# Patient Record
Sex: Male | Born: 1960 | Marital: Married | Smoking: Never smoker
Health system: Southern US, Community
[De-identification: ages and names within clinical notes are randomized; demographics above are authoritative.]

## PROBLEM LIST (undated history)

## (undated) DIAGNOSIS — E119 Type 2 diabetes mellitus without complications: Secondary | ICD-10-CM

## (undated) DIAGNOSIS — I10 Essential (primary) hypertension: Secondary | ICD-10-CM

## (undated) HISTORY — PX: ARTHROSCOPIC REPAIR ACL: SUR80

## (undated) HISTORY — PX: PECTORALIS TENDON REPAIR: SHX6510

---

## 2011-06-14 ENCOUNTER — Ambulatory Visit (HOSPITAL_BASED_OUTPATIENT_CLINIC_OR_DEPARTMENT_OTHER)
Admission: RE | Admit: 2011-06-14 | Discharge: 2011-06-14 | Disposition: A | Discharge status: Home / Self Care | Payer: BC Managed Care – PPO | Source: Ambulatory Visit | Attending: Orthopedic Surgery | Admitting: Orthopedic Surgery

## 2011-06-14 DIAGNOSIS — Z01812 Encounter for preprocedural laboratory examination: Secondary | ICD-10-CM | POA: Insufficient documentation

## 2011-06-14 DIAGNOSIS — M2240 Chondromalacia patellae, unspecified knee: Secondary | ICD-10-CM | POA: Insufficient documentation

## 2011-06-14 DIAGNOSIS — M23329 Other meniscus derangements, posterior horn of medial meniscus, unspecified knee: Secondary | ICD-10-CM | POA: Insufficient documentation

## 2011-06-14 LAB — POCT I-STAT 4, (NA,K, GLUC, HGB,HCT)
Glucose, Bld: 122 mg/dL — ABNORMAL HIGH (ref 70–99)
HCT: 45 % (ref 39.0–52.0)
Hemoglobin: 15.3 g/dL (ref 13.0–17.0)

## 2011-06-17 NOTE — Op Note (Signed)
  [name], [name]             ACCOUNT NO.:  [id number]  MEDICAL RECORD NO.:  [id number]  LOCATION:                                 FACILITY:  PHYSICIAN:  Ollen Gross, M.D.    DATE OF BIRTH:  1961/01/29  DATE OF PROCEDURE:  06/14/2011 DATE OF DISCHARGE:                              OPERATIVE REPORT   PREOPERATIVE DIAGNOSIS:  Right knee medial meniscal tear.  POSTOPERATIVE DIAGNOSIS:  Right knee medial meniscal tear.  PROCEDURE:  Right knee arthroscopy with meniscal debridement.  SURGEON:  Ollen Gross, MD  ASSISTANT.:  None.  ANESTHESIA:  General.  ESTIMATED BLOOD LOSS:  Minimal.  DRAINS:  None.  COMPLICATIONS:  None.  CONDITION:  Stable to recovery.  BRIEF CLINICAL NOTE:  Jimmy Hernandez is a 50 year old male who has a several- month history of significant right knee pain and mechanical symptoms. Exam and history suggested a medial meniscal tear confirmed by MRI.  He presents for arthroscopy and debridement.  PROCEDURE IN DETAIL:  After successful administration of general anesthetic, a tourniquet was placed high on his right thigh and his right lower extremity was prepped and draped in the usual sterile fashion.  Standard superomedial and inferolateral incisions were made, inflow cannula passed, superomedial camera passed inferolateral. Arthroscopic visualization proceeds.  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  Medial and lateral gutters were visualized.  There were no loose bodies.  Flexion valgus force was applied to the knee and medial compartment was entered.  He had a bad tear in the body of the posterior horn and medial meniscus with a fragment flipped centrally.  A spinal needle was used to localize the inferomedial portal.  Small incision made and dilator placed.  The meniscus was debrided back to stable base with a combination of baskets and a 4.2-mm  shaver and then sealed off with the ArthroCare device.  It was found to be stable after this.  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  Intercondylar notch was visualized.  He does have attenuation of the ACL.  Lateral compartment was entered and it looks normal.  Joints again inspected.  No other tears, loose bodies, or defects were noted.  The arthroscopic equipment was then removed from the inferior portals which were closed with interrupted 4-0 nylon.  20 cc of 0.25% Marcaine with epi were then injected through the inflow cannula and then that was removed and that portal closed with nylon.  Incisions were cleaned and dried and a bulky sterile dressing was applied.  He was then awakened and transported to recovery in stable condition.     Ollen Gross, M.D.     FA/MEDQ  D:  06/14/2011  T:  06/14/2011  Job:  [id number]  Electronically Signed by Ollen Gross M.D. on 06/17/2011 04:42:20 PM

## 2016-05-08 DIAGNOSIS — M79641 Pain in right hand: Secondary | ICD-10-CM | POA: Diagnosis not present

## 2016-05-08 DIAGNOSIS — M1811 Unilateral primary osteoarthritis of first carpometacarpal joint, right hand: Secondary | ICD-10-CM | POA: Diagnosis not present

## 2016-05-08 DIAGNOSIS — M79644 Pain in right finger(s): Secondary | ICD-10-CM | POA: Diagnosis not present

## 2016-06-18 DIAGNOSIS — H00012 Hordeolum externum right lower eyelid: Secondary | ICD-10-CM | POA: Diagnosis not present

## 2016-08-08 DIAGNOSIS — Z23 Encounter for immunization: Secondary | ICD-10-CM | POA: Diagnosis not present

## 2016-08-08 DIAGNOSIS — E782 Mixed hyperlipidemia: Secondary | ICD-10-CM | POA: Diagnosis not present

## 2016-08-08 DIAGNOSIS — J4530 Mild persistent asthma, uncomplicated: Secondary | ICD-10-CM | POA: Diagnosis not present

## 2016-08-08 DIAGNOSIS — I10 Essential (primary) hypertension: Secondary | ICD-10-CM | POA: Diagnosis not present

## 2016-08-08 DIAGNOSIS — R7309 Other abnormal glucose: Secondary | ICD-10-CM | POA: Diagnosis not present

## 2016-08-08 DIAGNOSIS — J309 Allergic rhinitis, unspecified: Secondary | ICD-10-CM | POA: Diagnosis not present

## 2016-12-02 DIAGNOSIS — J45909 Unspecified asthma, uncomplicated: Secondary | ICD-10-CM | POA: Diagnosis not present

## 2016-12-02 DIAGNOSIS — J069 Acute upper respiratory infection, unspecified: Secondary | ICD-10-CM | POA: Diagnosis not present

## 2017-02-19 DIAGNOSIS — J309 Allergic rhinitis, unspecified: Secondary | ICD-10-CM | POA: Diagnosis not present

## 2017-04-16 DIAGNOSIS — E782 Mixed hyperlipidemia: Secondary | ICD-10-CM | POA: Diagnosis not present

## 2017-04-16 DIAGNOSIS — Z125 Encounter for screening for malignant neoplasm of prostate: Secondary | ICD-10-CM | POA: Diagnosis not present

## 2017-04-16 DIAGNOSIS — Z0000 Encounter for general adult medical examination without abnormal findings: Secondary | ICD-10-CM | POA: Diagnosis not present

## 2017-04-16 DIAGNOSIS — R739 Hyperglycemia, unspecified: Secondary | ICD-10-CM | POA: Diagnosis not present

## 2017-06-30 DIAGNOSIS — M722 Plantar fascial fibromatosis: Secondary | ICD-10-CM | POA: Diagnosis not present

## 2017-06-30 DIAGNOSIS — M7731 Calcaneal spur, right foot: Secondary | ICD-10-CM | POA: Diagnosis not present

## 2017-06-30 DIAGNOSIS — M71571 Other bursitis, not elsewhere classified, right ankle and foot: Secondary | ICD-10-CM | POA: Diagnosis not present

## 2017-06-30 DIAGNOSIS — M7732 Calcaneal spur, left foot: Secondary | ICD-10-CM | POA: Diagnosis not present

## 2017-08-05 DIAGNOSIS — J029 Acute pharyngitis, unspecified: Secondary | ICD-10-CM | POA: Diagnosis not present

## 2017-08-05 DIAGNOSIS — I888 Other nonspecific lymphadenitis: Secondary | ICD-10-CM | POA: Diagnosis not present

## 2017-08-07 DIAGNOSIS — K118 Other diseases of salivary glands: Secondary | ICD-10-CM | POA: Diagnosis not present

## 2017-10-06 DIAGNOSIS — E782 Mixed hyperlipidemia: Secondary | ICD-10-CM | POA: Diagnosis not present

## 2017-10-06 DIAGNOSIS — E119 Type 2 diabetes mellitus without complications: Secondary | ICD-10-CM | POA: Diagnosis not present

## 2017-10-06 DIAGNOSIS — J4530 Mild persistent asthma, uncomplicated: Secondary | ICD-10-CM | POA: Diagnosis not present

## 2017-10-06 DIAGNOSIS — I10 Essential (primary) hypertension: Secondary | ICD-10-CM | POA: Diagnosis not present

## 2018-01-09 DIAGNOSIS — R509 Fever, unspecified: Secondary | ICD-10-CM | POA: Diagnosis not present

## 2018-01-09 DIAGNOSIS — R05 Cough: Secondary | ICD-10-CM | POA: Diagnosis not present

## 2018-04-21 DIAGNOSIS — E782 Mixed hyperlipidemia: Secondary | ICD-10-CM | POA: Diagnosis not present

## 2018-04-21 DIAGNOSIS — Z0000 Encounter for general adult medical examination without abnormal findings: Secondary | ICD-10-CM | POA: Diagnosis not present

## 2018-04-21 DIAGNOSIS — Z1159 Encounter for screening for other viral diseases: Secondary | ICD-10-CM | POA: Diagnosis not present

## 2018-04-21 DIAGNOSIS — E119 Type 2 diabetes mellitus without complications: Secondary | ICD-10-CM | POA: Diagnosis not present

## 2018-04-28 ENCOUNTER — Other Ambulatory Visit: Payer: Self-pay

## 2018-04-28 ENCOUNTER — Emergency Department (HOSPITAL_COMMUNITY): Payer: BLUE CROSS/BLUE SHIELD

## 2018-04-28 ENCOUNTER — Emergency Department (HOSPITAL_COMMUNITY)
Admission: EM | Admit: 2018-04-28 | Discharge: 2018-04-28 | Disposition: A | Discharge status: Home / Self Care | Payer: BLUE CROSS/BLUE SHIELD | Attending: Emergency Medicine | Admitting: Emergency Medicine

## 2018-04-28 ENCOUNTER — Encounter (HOSPITAL_COMMUNITY): Payer: Self-pay | Admitting: Emergency Medicine

## 2018-04-28 DIAGNOSIS — R1030 Lower abdominal pain, unspecified: Secondary | ICD-10-CM | POA: Diagnosis not present

## 2018-04-28 DIAGNOSIS — N201 Calculus of ureter: Secondary | ICD-10-CM | POA: Insufficient documentation

## 2018-04-28 DIAGNOSIS — E119 Type 2 diabetes mellitus without complications: Secondary | ICD-10-CM | POA: Insufficient documentation

## 2018-04-28 DIAGNOSIS — R112 Nausea with vomiting, unspecified: Secondary | ICD-10-CM | POA: Diagnosis not present

## 2018-04-28 DIAGNOSIS — R109 Unspecified abdominal pain: Secondary | ICD-10-CM | POA: Diagnosis not present

## 2018-04-28 DIAGNOSIS — Z7984 Long term (current) use of oral hypoglycemic drugs: Secondary | ICD-10-CM | POA: Diagnosis not present

## 2018-04-28 DIAGNOSIS — I10 Essential (primary) hypertension: Secondary | ICD-10-CM | POA: Diagnosis not present

## 2018-04-28 DIAGNOSIS — R1031 Right lower quadrant pain: Secondary | ICD-10-CM | POA: Diagnosis present

## 2018-04-28 HISTORY — DX: Essential (primary) hypertension: I10

## 2018-04-28 HISTORY — DX: Type 2 diabetes mellitus without complications: E11.9

## 2018-04-28 LAB — URINALYSIS, ROUTINE W REFLEX MICROSCOPIC
BACTERIA UA: NONE SEEN
Bilirubin Urine: NEGATIVE
GLUCOSE, UA: NEGATIVE mg/dL
KETONES UR: 5 mg/dL — AB
Leukocytes, UA: NEGATIVE
Nitrite: NEGATIVE
PROTEIN: 30 mg/dL — AB
RBC / HPF: >50 RBC/hpf — ABNORMAL HIGH (ref 0–5)
Specific Gravity, Urine: 1.016 (ref 1.005–1.030)
pH: 5 (ref 5.0–8.0)

## 2018-04-28 LAB — CBC WITH DIFFERENTIAL/PLATELET
BASOS ABS: 0 10*3/uL (ref 0.0–0.1)
BASOS PCT: 0 %
Eosinophils Absolute: 0.2 10*3/uL (ref 0.0–0.7)
Eosinophils Relative: 2 %
HEMATOCRIT: 43.9 % (ref 39.0–52.0)
Hemoglobin: 15.3 g/dL (ref 13.0–17.0)
LYMPHS PCT: 16 %
Lymphs Abs: 2 10*3/uL (ref 0.7–4.0)
MCH: 30.9 pg (ref 26.0–34.0)
MCHC: 34.9 g/dL (ref 30.0–36.0)
MCV: 88.7 fL (ref 78.0–100.0)
MONO ABS: 0.8 10*3/uL (ref 0.1–1.0)
Monocytes Relative: 7 %
NEUTROS ABS: 9.1 10*3/uL — AB (ref 1.7–7.7)
NEUTROS PCT: 75 %
Platelets: 173 10*3/uL (ref 150–400)
RBC: 4.95 MIL/uL (ref 4.22–5.81)
RDW: 13.2 % (ref 11.5–15.5)
WBC: 12 10*3/uL — AB (ref 4.0–10.5)

## 2018-04-28 LAB — I-STAT CHEM 8, ED
BUN: 22 mg/dL — ABNORMAL HIGH (ref 6–20)
CHLORIDE: 105 mmol/L (ref 101–111)
CREATININE: 0.9 mg/dL (ref 0.61–1.24)
Calcium, Ion: 1.16 mmol/L (ref 1.15–1.40)
GLUCOSE: 171 mg/dL — AB (ref 65–99)
HEMATOCRIT: 43 % (ref 39.0–52.0)
HEMOGLOBIN: 14.6 g/dL (ref 13.0–17.0)
POTASSIUM: 4.2 mmol/L (ref 3.5–5.1)
Sodium: 139 mmol/L (ref 135–145)
TCO2: 23 mmol/L (ref 22–32)

## 2018-04-28 MED ORDER — ONDANSETRON HCL 4 MG/2ML IJ SOLN
4.0000 mg | Freq: Once | INTRAMUSCULAR | Status: AC
  Administered 2018-04-28: 4 mg via INTRAVENOUS
  Filled 2018-04-28: qty 2

## 2018-04-28 MED ORDER — HYDROMORPHONE HCL 1 MG/ML IJ SOLN
0.5000 mg | Freq: Once | INTRAMUSCULAR | Status: AC
  Administered 2018-04-28: 0.5 mg via INTRAVENOUS

## 2018-04-28 MED ORDER — ONDANSETRON 4 MG PO TBDP: 4.0000 mg | ORAL_TABLET | Freq: Three times a day (TID) | ORAL | 0 refills | Status: AC | PRN

## 2018-04-28 MED ORDER — PROCHLORPERAZINE EDISYLATE 10 MG/2ML IJ SOLN
5.0000 mg | Freq: Once | INTRAMUSCULAR | Status: AC
  Administered 2018-04-28: 5 mg via INTRAVENOUS
  Filled 2018-04-28: qty 2

## 2018-04-28 MED ORDER — TAMSULOSIN HCL 0.4 MG PO CAPS: 0.4000 mg | ORAL_CAPSULE | Freq: Every day | ORAL | 0 refills | Status: AC

## 2018-04-28 MED ORDER — SODIUM CHLORIDE 0.9 % IV BOLUS
1000.0000 mL | Freq: Once | INTRAVENOUS | Status: AC
  Administered 2018-04-28: 1000 mL via INTRAVENOUS

## 2018-04-28 MED ORDER — KETOROLAC TROMETHAMINE 30 MG/ML IJ SOLN
15.0000 mg | Freq: Once | INTRAMUSCULAR | Status: AC
  Administered 2018-04-28: 15 mg via INTRAVENOUS
  Filled 2018-04-28: qty 1

## 2018-04-28 MED ORDER — HYDROCODONE-ACETAMINOPHEN 5-325 MG PO TABS: 2.0000 | ORAL_TABLET | ORAL | 0 refills | Status: AC | PRN

## 2018-04-28 MED ORDER — HYDROMORPHONE HCL 1 MG/ML IJ SOLN
0.5000 mg | Freq: Once | INTRAMUSCULAR | Status: AC
  Administered 2018-04-28: 0.5 mg via INTRAVENOUS
  Filled 2018-04-28: qty 1

## 2018-04-28 MED ORDER — ONDANSETRON HCL 4 MG/2ML IJ SOLN
4.0000 mg | Freq: Once | INTRAMUSCULAR | Status: DC
  Filled 2018-04-28: qty 2

## 2018-04-28 MED ORDER — HYDROMORPHONE HCL 1 MG/ML IJ SOLN
0.5000 mg | Freq: Once | INTRAMUSCULAR | Status: DC
  Filled 2018-04-28: qty 1

## 2018-04-28 NOTE — ED Notes (Signed)
I attempted to collect a lab and was unsuccessful

## 2018-04-28 NOTE — ED Provider Notes (Signed)
[facility]-EMERGENCY DEPT Provider Note   CSN: [id number] Arrival date & time: 04/28/18  0408     History   Chief Complaint Chief Complaint  Patient presents with  . Flank Pain    HPI Jimmy Hernandez is a 57 y.o. male.  HPI 57 year old Caucasian male past medical history significant for diabetes and hypertension presents to the emergency department today for evaluation of acute right flank pain.  Patient states that approximately 0230 he developed acute onset of right flank pain.  Patient reports the pain radiates to his lower abdomen.  Patient denies urinary symptoms.  Reports associated nausea and vomiting.  No history of same.  Patient denies associated fevers or chills.  Denies any testicular pain, testicular swelling or penile discharge.  Patient states that the pain is constant and sharp in nature.  Nothing makes better or worse.  Patient did not take anything for the pain prior to arrival.  Patient reports pain is a 10/10.  Pt denies any fever, chill, ha, vision changes, lightheadedness, dizziness, congestion, neck pain, cp, sob, cough,  urinary symptoms, change in bowel habits, melena, hematochezia, lower extremity paresthesias.  Past Medical History:  Diagnosis Date  . Diabetes mellitus without complication (HCC)   . Hypertension     There are no active problems to display for this patient.   Past Surgical History:  Procedure Laterality Date  . ARTHROSCOPIC REPAIR ACL Right   . PECTORALIS TENDON REPAIR          Home Medications    Prior to Admission medications   Medication Sig Start Date End Date Taking? Authorizing Provider  budesonide-formoterol (SYMBICORT) 80-4.5 MCG/ACT inhaler Inhale 2 puffs into the lungs daily.   Yes [provider]  cetirizine (ZYRTEC) 10 MG tablet Take 10 mg by mouth daily.   Yes [provider]  lisinopril (PRINIVIL,ZESTRIL) 10 MG tablet Take 10 mg by mouth daily.   Yes [provider]    metFORMIN (GLUCOPHAGE-XR) 500 MG 24 hr tablet Take 1,000 mg by mouth daily. 04/06/18  Yes [provider]  Omega-3 Fatty Acids (FISH OIL) 1200 MG CAPS Take 1,200-4,800 mg by mouth daily.   Yes [provider]  rosuvastatin (CRESTOR) 20 MG tablet Take 20 mg by mouth daily. 04/15/18  Yes [provider]    Family History History reviewed. No pertinent family history.  Social History Social History   Tobacco Use  . Smoking status: Never Smoker  . Smokeless tobacco: Never Used  Substance Use Topics  . Alcohol use: Never    Frequency: Never  . Drug use: Never     Allergies   Percocet [oxycodone-acetaminophen]   Review of Systems Review of Systems  All other systems reviewed and are negative.    Physical Exam Updated Vital Signs BP 132/72 (BP Location: Left Arm)   Pulse (!) 58   Temp 97.6 F (36.4 C) (Oral)   Resp 19   Ht 6\' 1"  (1.854 m)   Wt 99.8 kg (220 lb)   SpO2 100%   BMI 29.03 kg/m   Physical Exam  Constitutional: He is oriented to person, place, and time. He appears well-developed and well-nourished.  Non-toxic appearance. He appears distressed.  Patient appears very uncomfortable secondary to pain.  HENT:  Head: Normocephalic and atraumatic.  Mouth/Throat: Oropharynx is clear and moist.  Eyes: Pupils are equal, round, and reactive to light. Conjunctivae are normal. Right eye exhibits no discharge. Left eye exhibits no discharge.  Neck: Normal range of  motion. Neck supple.  Cardiovascular: Normal rate, regular rhythm, normal heart sounds and intact distal pulses. Exam reveals no gallop and no friction rub.  No murmur heard. Pulmonary/Chest: Effort normal and breath sounds normal. No stridor. No respiratory distress. He has no wheezes. He has no rales. He exhibits no tenderness.  Abdominal: Soft. Normal appearance and bowel sounds are normal. He exhibits no distension. There is tenderness in the right lower quadrant, suprapubic area  and left lower quadrant. There is CVA tenderness (right). There is no rigidity, no rebound, no guarding, no tenderness at McBurney's point and negative Murphy's sign.  Musculoskeletal: Normal range of motion. He exhibits no tenderness.  Lymphadenopathy:    He has no cervical adenopathy.  Neurological: He is alert and oriented to person, place, and time.  Skin: Skin is warm and dry. Capillary refill takes less than 2 seconds. No rash noted.  Psychiatric: His behavior is normal. Judgment and thought content normal.  Nursing note and vitals reviewed.    ED Treatments / Results  Labs (all labs ordered are listed, but only abnormal results are displayed) Labs Reviewed  URINALYSIS, ROUTINE W REFLEX MICROSCOPIC - Abnormal; Notable for the following components:      Result Value   Hgb urine dipstick LARGE (*)    Ketones, ur 5 (*)    Protein, ur 30 (*)    RBC / HPF >50 (*)    All other components within normal limits  CBC WITH DIFFERENTIAL/PLATELET - Abnormal; Notable for the following components:   WBC 12.0 (*)    Neutro Abs 9.1 (*)    All other components within normal limits  I-STAT CHEM 8, ED - Abnormal; Notable for the following components:   BUN 22 (*)    Glucose, Bld 171 (*)    All other components within normal limits    EKG None  Radiology Ct Renal Stone Study  Result Date: 04/28/2018 CLINICAL DATA:  Acute onset of right flank pain and nausea. EXAM: CT ABDOMEN AND PELVIS WITHOUT CONTRAST TECHNIQUE: Multidetector CT imaging of the abdomen and pelvis was performed following the standard protocol without IV contrast. COMPARISON:  None. FINDINGS: Lower chest: The visualized lung bases are grossly clear. The visualized portions of the mediastinum are unremarkable. Hepatobiliary: The liver is unremarkable in appearance. The gallbladder is unremarkable in appearance. The common bile duct remains normal in caliber. Pancreas: The pancreas is within normal limits. Spleen: The spleen is  unremarkable in appearance. Adrenals/Urinary Tract: The adrenal glands are unremarkable in appearance. Mild right-sided hydronephrosis is noted, with an obstructing 4 mm stone noted at the proximal right ureter, 4 cm below the right renal pelvis. Mild right-sided perinephric stranding is noted. Minimal left-sided perinephric stranding is seen. No nonobstructing renal stones are identified. Stomach/Bowel: The stomach is unremarkable in appearance. The small bowel is within normal limits. The appendix is normal in caliber, without evidence of appendicitis. The colon is unremarkable in appearance. Vascular/Lymphatic: Scattered calcification is seen along the abdominal aorta and its branches. The abdominal aorta is otherwise grossly unremarkable. The inferior vena cava is grossly unremarkable. No retroperitoneal lymphadenopathy is seen. No pelvic sidewall lymphadenopathy is identified. Reproductive: The bladder is mildly distended and grossly unremarkable. The prostate is enlarged, measuring 5.2 cm in transverse dimension. Other: A small left inguinal hernia is noted, containing only fat. The superior position of the right testis may reflect transient retraction. Musculoskeletal: No acute osseous abnormalities are identified. Facet disease is noted at the lower lumbar spine. The visualized musculature  is unremarkable in appearance. IMPRESSION: 1. Mild right-sided hydronephrosis, with an obstructing 4 mm stone at the proximal right ureter, 4 cm below the right renal pelvis. 2. Enlarged prostate noted. 3. Small left inguinal hernia, containing only fat. Aortic Atherosclerosis (ICD10-I70.0). Electronically Signed   By: Roanna Raider M.D.   On: 04/28/2018 06:00    Procedures Procedures (including critical care time)  Medications Ordered in ED Medications  sodium chloride 0.9 % bolus 1,000 mL (1,000 mLs Intravenous New Bag/Given 04/28/18 0514)  ondansetron (ZOFRAN) injection 4 mg (4 mg Intravenous Given 04/28/18 0509)    HYDROmorphone (DILAUDID) injection 0.5 mg (0.5 mg Intravenous Given 04/28/18 0510)     Initial Impression / Assessment and Plan / ED Course  I have reviewed the triage vital signs and the nursing notes.  Pertinent labs & imaging results that were available during my care of the patient were reviewed by me and considered in my medical decision making (see chart for details).     Pt has been diagnosed with a Kidney Stone via CT. There is no evidence of significant hydronephrosis, serum creatine WNL, vitals sign stable and the pt does not have irratractable vomiting. Pt will be dc home with pain medications & has been advised to follow up with PCP.   Pt is hemodynamically stable, in NAD, & able to ambulate in the ED. Evaluation does not show pathology that would require ongoing emergent intervention or inpatient treatment. I explained the diagnosis to the patient. Pain has been managed & has no complaints prior to dc. Pt is comfortable with above plan and is stable for discharge at this time. All questions were answered prior to disposition. Strict return precautions for f/u to the ED were discussed. Encouraged follow up with PCP.    Final Clinical Impressions(s) / ED Diagnoses   Final diagnoses:  Ureterolithiasis    ED Discharge Orders        Ordered    HYDROcodone-acetaminophen (NORCO/VICODIN) 5-325 MG tablet  Every 4 hours PRN     04/28/18 0735    ondansetron (ZOFRAN ODT) 4 MG disintegrating tablet  Every 8 hours PRN     04/28/18 0735    tamsulosin (FLOMAX) 0.4 MG CAPS capsule  Daily after breakfast     04/28/18 0735       Rise Mu, PA-C 04/28/18 2201    Dione Booze, MD 04/28/18 2240

## 2018-04-28 NOTE — ED Triage Notes (Signed)
Pt reports new onset of right flank pain that started at 0230 along with nausea.

## 2018-04-28 NOTE — Discharge Instructions (Addendum)
You have been diagnosed with kidney stones.  Drink plenty of fluids to help you pass the stone.  Take  ibuprofen / naproxen as directed with food for mild to moderate pain. Use your pain medication as directed and only as needed for severe pain. Taking flomax as directed will also help to pass the stone. Use Zofran for nausea as directed.  Follow up with the urology clinic listed in regards to your hospital visit.   Return to the ED immediately if you develop fever that persists > 101, uncontrolled pain or vomiting, or other concerns.   Do not drink alcohol, drive or participate in any other potentially dangerous activities while taking opiate pain medication as it may make you sleepy. Do not take this medication with any other sedating medications, either prescription or over-the-counter. If you were prescribed Percocet or Vicodin, do not take these with acetaminophen (Tylenol) as it is already contained within these medications.   This medication is an opiate (or narcotic) pain medication and can be habit forming.  Use it as little as possible to achieve adequate pain control.  Do not use or use it with extreme caution if you have a history of opiate abuse or dependence. This medication is intended for your use only - do not give any to anyone else and keep it in a secure place where nobody else, especially children, have access to it. It will also cause or worsen constipation, so you may want to consider taking an over-the-counter stool softener while you are taking this medication.  

## 2018-04-28 NOTE — ED Provider Notes (Signed)
Patient  Taken in sign out from [place] + 4mm kidney stone R Awaiting pain control PO fluids and discharge.   9:03 AM BP 132/72 (BP Location: Left Arm)   Pulse 70   Temp 97.6 F (36.4 C) (Oral)   Resp 18   Ht 6\' 1"  (1.854 m)   Wt 99.8 kg (220 lb)   SpO2 99%   BMI 29.03 kg/m  Patient feeling improved without pain, nausea or vomiting.  Tolerating p.o. fluids.  I have discussed the findings and discussed discharge instructions and expectations.  Patient appears appropriate for discharge at this time.            Results for orders placed or performed during the hospital encounter of 04/28/18  Urinalysis, Routine w reflex microscopic- may I&O cath if menses  Result Value Ref Range   Color, Urine YELLOW YELLOW   APPearance CLEAR CLEAR   Specific Gravity, Urine 1.016 1.005 - 1.030   pH 5.0 5.0 - 8.0   Glucose, UA NEGATIVE NEGATIVE mg/dL   Hgb urine dipstick LARGE (A) NEGATIVE   Bilirubin Urine NEGATIVE NEGATIVE   Ketones, ur 5 (A) NEGATIVE mg/dL   Protein, ur 30 (A) NEGATIVE mg/dL   Nitrite NEGATIVE NEGATIVE   Leukocytes, UA NEGATIVE NEGATIVE   RBC / HPF >50 (H) 0 - 5 RBC/hpf   WBC, UA 0-5 0 - 5 WBC/hpf   Bacteria, UA NONE SEEN NONE SEEN   Squamous Epithelial / LPF 0-5 0 - 5   Mucus PRESENT   CBC with Differential  Result Value Ref Range   WBC 12.0 (H) 4.0 - 10.5 K/uL   RBC 4.95 4.22 - 5.81 MIL/uL   Hemoglobin 15.3 13.0 - 17.0 g/dL   HCT 16.143.9 09.639.0 - 04.552.0 %   MCV 88.7 78.0 - 100.0 fL   MCH 30.9 26.0 - 34.0 pg   MCHC 34.9 30.0 - 36.0 g/dL   RDW 40.913.2 81.111.5 - 91.415.5 %   Platelets 173 150 - 400 K/uL   Neutrophils Relative % 75 %   Neutro Abs 9.1 (H) 1.7 - 7.7 K/uL   Lymphocytes Relative 16 %   Lymphs Abs 2.0 0.7 - 4.0 K/uL   Monocytes Relative 7 %   Monocytes Absolute 0.8 0.1 - 1.0 K/uL   Eosinophils Relative 2 %   Eosinophils Absolute 0.2 0.0 - 0.7 K/uL   Basophils Relative 0 %   Basophils Absolute 0.0 0.0 - 0.1 K/uL  I-Stat Chem 8, ED  Result Value Ref  Range   Sodium 139 135 - 145 mmol/L   Potassium 4.2 3.5 - 5.1 mmol/L   Chloride 105 101 - 111 mmol/L   BUN 22 (H) 6 - 20 mg/dL   Creatinine, Ser 7.820.90 0.61 - 1.24 mg/dL   Glucose, Bld 956171 (H) 65 - 99 mg/dL   Calcium, Ion 2.131.16 0.861.15 - 1.40 mmol/L   TCO2 23 22 - 32 mmol/L   Hemoglobin 14.6 13.0 - 17.0 g/dL   HCT 57.843.0 46.939.0 - 62.952.0 %      Arthor CaptainHarris, Kaya Klausing, PA-C 04/28/18 [id number]    Azalia Bilisampos, Kevin, MD 04/28/18 1534

## 2018-05-29 DIAGNOSIS — H04123 Dry eye syndrome of bilateral lacrimal glands: Secondary | ICD-10-CM | POA: Diagnosis not present

## 2018-10-15 DIAGNOSIS — E119 Type 2 diabetes mellitus without complications: Secondary | ICD-10-CM | POA: Diagnosis not present

## 2018-10-15 DIAGNOSIS — I10 Essential (primary) hypertension: Secondary | ICD-10-CM | POA: Diagnosis not present

## 2018-10-15 DIAGNOSIS — E782 Mixed hyperlipidemia: Secondary | ICD-10-CM | POA: Diagnosis not present

## 2018-10-15 DIAGNOSIS — J4530 Mild persistent asthma, uncomplicated: Secondary | ICD-10-CM | POA: Diagnosis not present

## 2019-01-04 DIAGNOSIS — M722 Plantar fascial fibromatosis: Secondary | ICD-10-CM | POA: Diagnosis not present

## 2019-01-04 DIAGNOSIS — M12271 Villonodular synovitis (pigmented), right ankle and foot: Secondary | ICD-10-CM | POA: Diagnosis not present

## 2019-01-04 DIAGNOSIS — M71571 Other bursitis, not elsewhere classified, right ankle and foot: Secondary | ICD-10-CM | POA: Diagnosis not present

## 2019-01-04 DIAGNOSIS — M12272 Villonodular synovitis (pigmented), left ankle and foot: Secondary | ICD-10-CM | POA: Diagnosis not present

## 2019-01-04 DIAGNOSIS — M71572 Other bursitis, not elsewhere classified, left ankle and foot: Secondary | ICD-10-CM | POA: Diagnosis not present

## 2019-04-27 DIAGNOSIS — Z0000 Encounter for general adult medical examination without abnormal findings: Secondary | ICD-10-CM | POA: Diagnosis not present

## 2019-05-04 DIAGNOSIS — E119 Type 2 diabetes mellitus without complications: Secondary | ICD-10-CM | POA: Diagnosis not present

## 2019-05-04 DIAGNOSIS — Z125 Encounter for screening for malignant neoplasm of prostate: Secondary | ICD-10-CM | POA: Diagnosis not present

## 2019-05-04 DIAGNOSIS — Z23 Encounter for immunization: Secondary | ICD-10-CM | POA: Diagnosis not present

## 2019-05-04 DIAGNOSIS — E782 Mixed hyperlipidemia: Secondary | ICD-10-CM | POA: Diagnosis not present

## 2019-06-15 DIAGNOSIS — L259 Unspecified contact dermatitis, unspecified cause: Secondary | ICD-10-CM | POA: Diagnosis not present

## 2019-06-15 DIAGNOSIS — J4530 Mild persistent asthma, uncomplicated: Secondary | ICD-10-CM | POA: Diagnosis not present

## 2019-10-26 IMAGING — CT CT RENAL STONE PROTOCOL
2 of 4 series · 16 of 46 positions shown, 18 images · non-contrast
Comparison: None.

CLINICAL DATA: Acute onset of right flank pain and nausea.

EXAM:
CT ABDOMEN AND PELVIS WITHOUT CONTRAST
TECHNIQUE: Multidetector CT imaging of the abdomen and pelvis was performed
following the standard protocol without IV contrast.

[Series 2: axial st · axial · 0.80mm/px · z∈[+1148,+1563]mm · 13 of 93 slices shown, 15 images]
[im 5/93  soft-tissue]
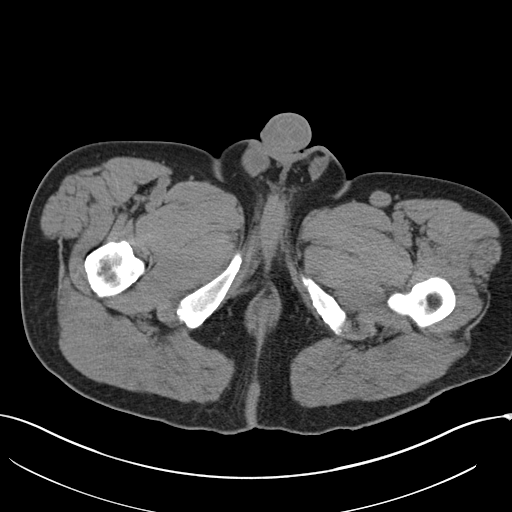
[im 5/93  bone]
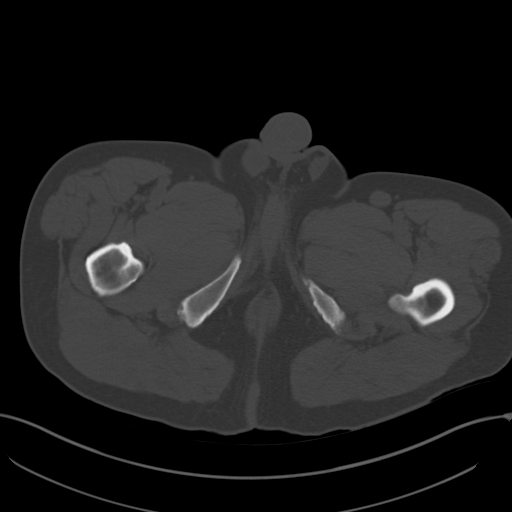
[im 13/93  soft-tissue]
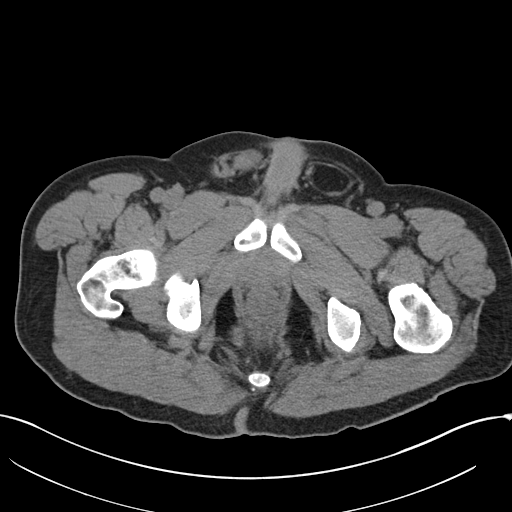
[im 21/93  soft-tissue]
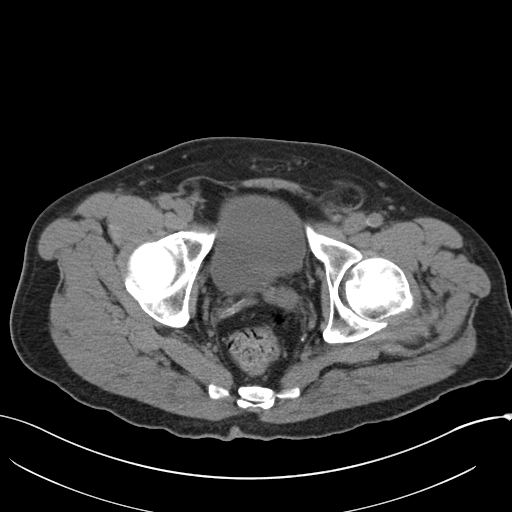
[im 26/93  soft-tissue]
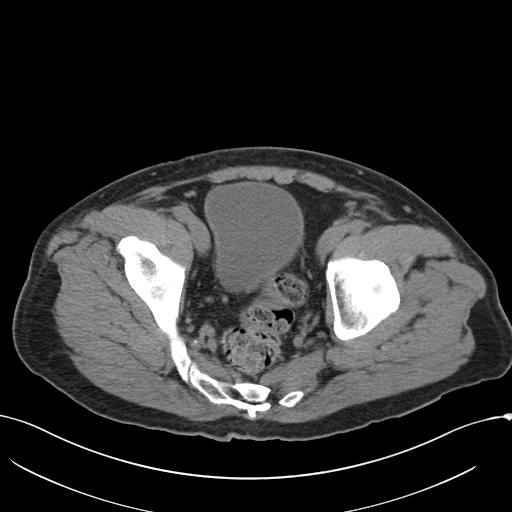
[im 34/93  soft-tissue]
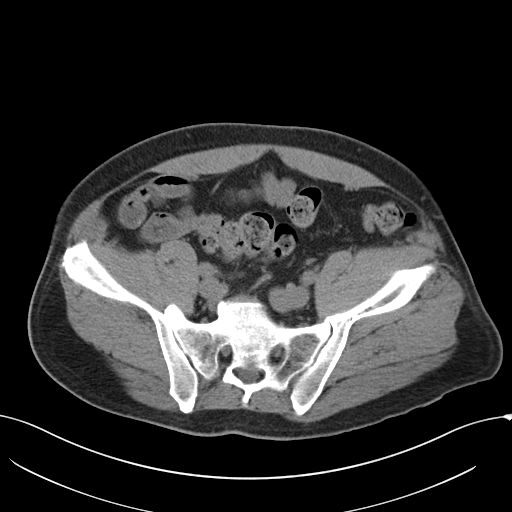
[im 38/93  soft-tissue]
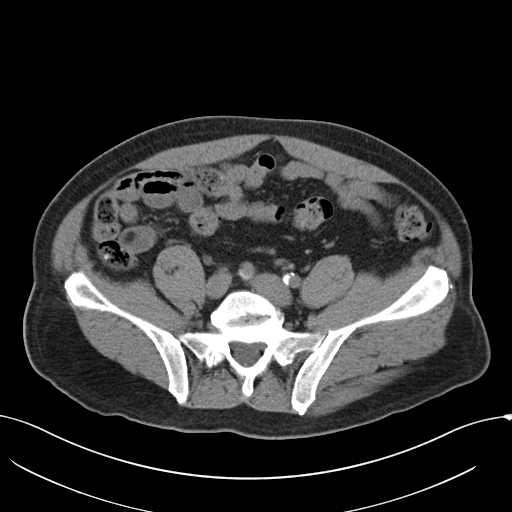
[im 47/93  soft-tissue]
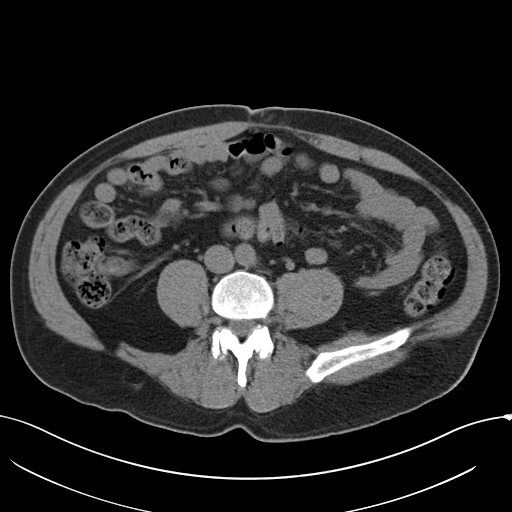
[im 55/93  soft-tissue]
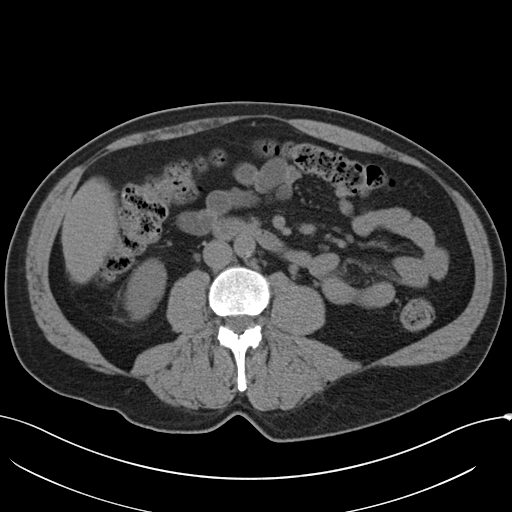
[im 59/93  soft-tissue]
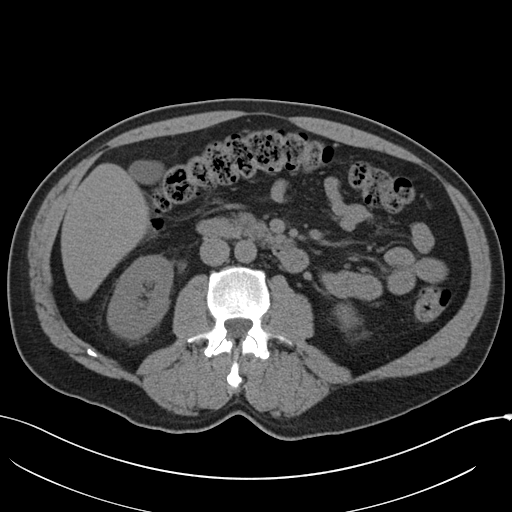
[im 59/93  bone]
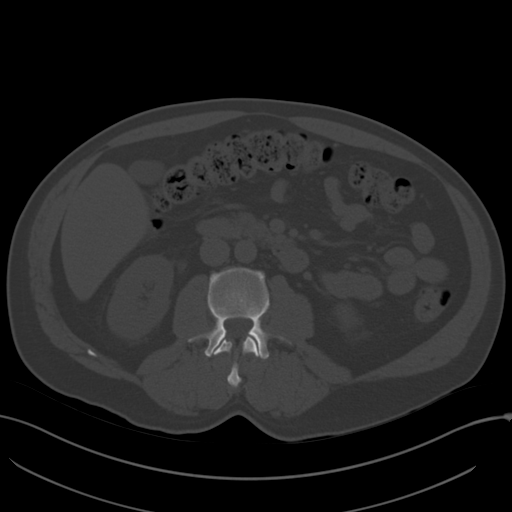
[im 67/93  soft-tissue]
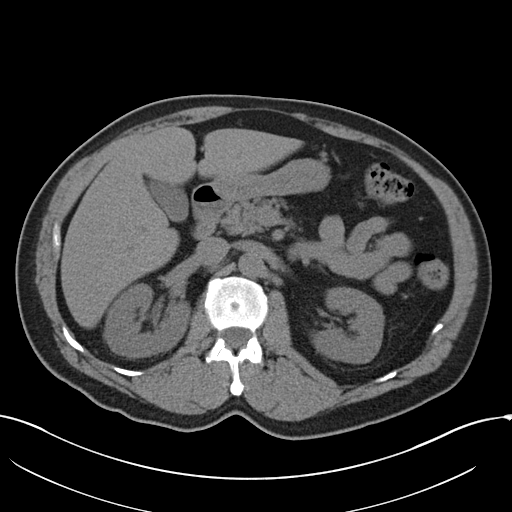
[im 72/93  soft-tissue]
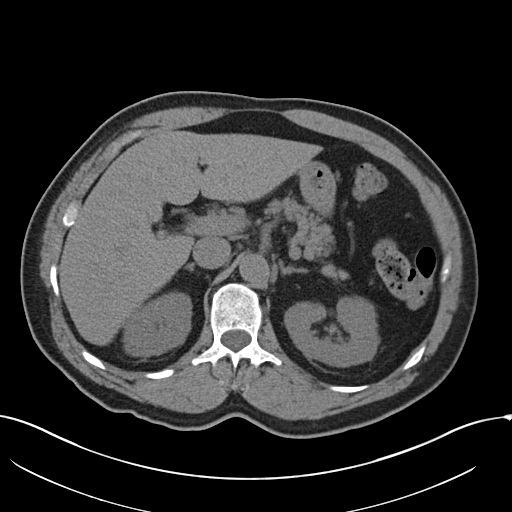
[im 80/93  soft-tissue]
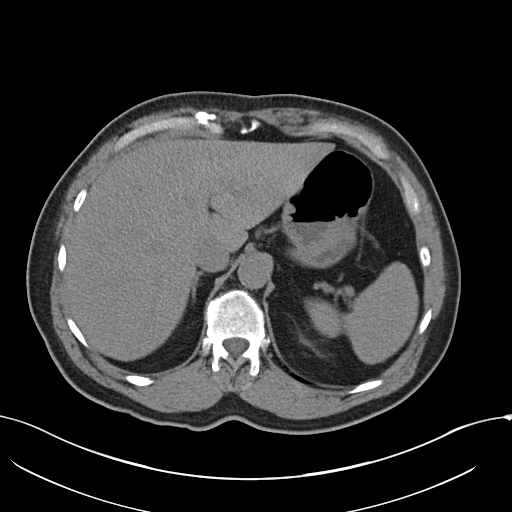
[im 88/93  soft-tissue]
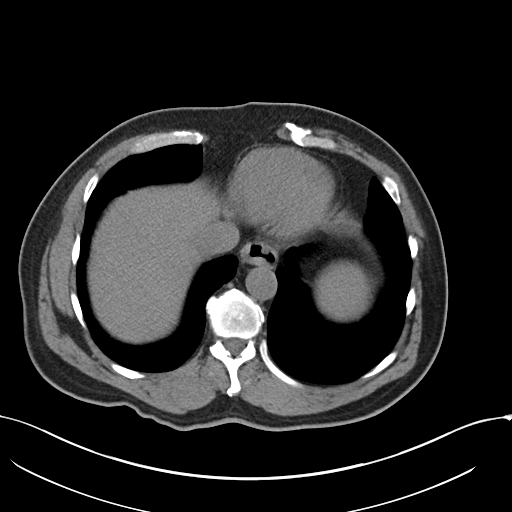

[Series 4: coronal · coronal · 0.75mm/px · 3 of 135 slices shown]
[im 45/135  soft-tissue]
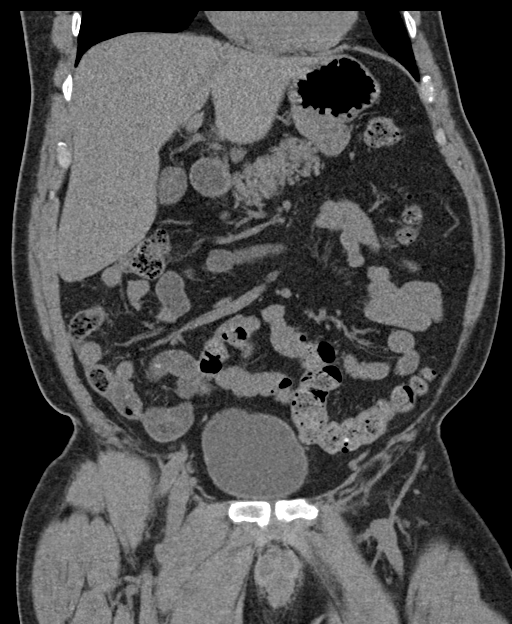
[im 60/135  soft-tissue]
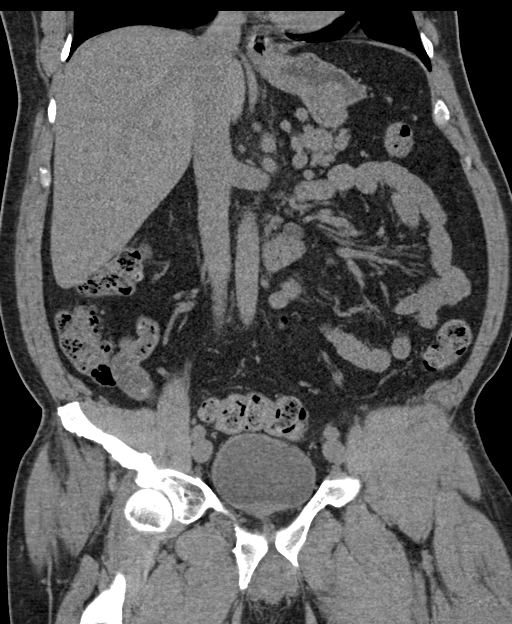
[im 75/135  soft-tissue]
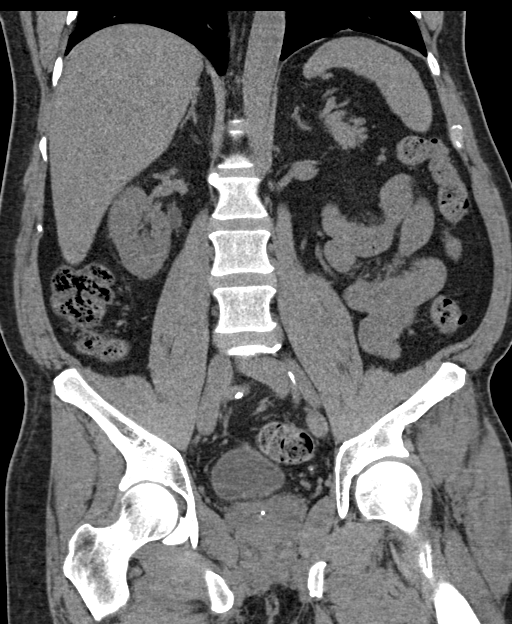

[16 of 46 positions shown; findings below may reference images not displayed]

FINDINGS: Lower chest: The visualized lung bases are grossly clear. The
visualized portions of the mediastinum are unremarkable.

Hepatobiliary: The liver is unremarkable in appearance. The
gallbladder is unremarkable in appearance. The common bile duct
remains normal in caliber.

Pancreas: The pancreas is within normal limits.

Spleen: The spleen is unremarkable in appearance.

Adrenals/Urinary Tract: The adrenal glands are unremarkable in
appearance.

Mild right-sided hydronephrosis is noted, with an obstructing 4 mm
stone noted at the proximal right ureter, 4 cm below the right renal
pelvis. Mild right-sided perinephric stranding is noted. Minimal
left-sided perinephric stranding is seen. No nonobstructing renal
stones are identified.

Stomach/Bowel: The stomach is unremarkable in appearance. The small
bowel is within normal limits. The appendix is normal in caliber,
without evidence of appendicitis. The colon is unremarkable in
appearance.

Vascular/Lymphatic: Scattered calcification is seen along the
abdominal aorta and its branches. The abdominal aorta is otherwise
grossly unremarkable. The inferior vena cava is grossly
unremarkable. No retroperitoneal lymphadenopathy is seen. No pelvic
sidewall lymphadenopathy is identified.

Reproductive: The bladder is mildly distended and grossly
unremarkable. The prostate is enlarged, measuring 5.2 cm in
transverse dimension.

Other: A small left inguinal hernia is noted, containing only fat.
The superior position of the right testis may reflect transient
retraction.

Musculoskeletal: No acute osseous abnormalities are identified.
Facet disease is noted at the lower lumbar spine. The visualized
musculature is unremarkable in appearance.
IMPRESSION: 1. Mild right-sided hydronephrosis, with an obstructing 4 mm stone
at the proximal right ureter, 4 cm below the right renal pelvis.
2. Enlarged prostate noted.
3. Small left inguinal hernia, containing only fat.

Aortic Atherosclerosis ([id number]).

## 2019-10-27 DIAGNOSIS — I10 Essential (primary) hypertension: Secondary | ICD-10-CM | POA: Diagnosis not present

## 2019-10-27 DIAGNOSIS — E119 Type 2 diabetes mellitus without complications: Secondary | ICD-10-CM | POA: Diagnosis not present

## 2019-10-27 DIAGNOSIS — J4530 Mild persistent asthma, uncomplicated: Secondary | ICD-10-CM | POA: Diagnosis not present

## 2019-10-27 DIAGNOSIS — E782 Mixed hyperlipidemia: Secondary | ICD-10-CM | POA: Diagnosis not present

## 2019-11-01 DIAGNOSIS — E119 Type 2 diabetes mellitus without complications: Secondary | ICD-10-CM | POA: Diagnosis not present

## 2020-05-26 DIAGNOSIS — Z0000 Encounter for general adult medical examination without abnormal findings: Secondary | ICD-10-CM | POA: Diagnosis not present

## 2020-05-26 DIAGNOSIS — Z125 Encounter for screening for malignant neoplasm of prostate: Secondary | ICD-10-CM | POA: Diagnosis not present

## 2020-05-26 DIAGNOSIS — E1169 Type 2 diabetes mellitus with other specified complication: Secondary | ICD-10-CM | POA: Diagnosis not present

## 2020-05-26 DIAGNOSIS — E782 Mixed hyperlipidemia: Secondary | ICD-10-CM | POA: Diagnosis not present

## 2020-12-04 DIAGNOSIS — E782 Mixed hyperlipidemia: Secondary | ICD-10-CM | POA: Diagnosis not present

## 2020-12-04 DIAGNOSIS — I10 Essential (primary) hypertension: Secondary | ICD-10-CM | POA: Diagnosis not present

## 2020-12-04 DIAGNOSIS — J4530 Mild persistent asthma, uncomplicated: Secondary | ICD-10-CM | POA: Diagnosis not present

## 2020-12-04 DIAGNOSIS — D485 Neoplasm of uncertain behavior of skin: Secondary | ICD-10-CM | POA: Diagnosis not present

## 2020-12-04 DIAGNOSIS — E1169 Type 2 diabetes mellitus with other specified complication: Secondary | ICD-10-CM | POA: Diagnosis not present

## 2021-05-30 DIAGNOSIS — Z0000 Encounter for general adult medical examination without abnormal findings: Secondary | ICD-10-CM | POA: Diagnosis not present

## 2021-05-30 DIAGNOSIS — E1169 Type 2 diabetes mellitus with other specified complication: Secondary | ICD-10-CM | POA: Diagnosis not present

## 2021-05-30 DIAGNOSIS — Z125 Encounter for screening for malignant neoplasm of prostate: Secondary | ICD-10-CM | POA: Diagnosis not present

## 2021-05-30 DIAGNOSIS — E782 Mixed hyperlipidemia: Secondary | ICD-10-CM | POA: Diagnosis not present

## 2021-05-30 DIAGNOSIS — Z23 Encounter for immunization: Secondary | ICD-10-CM | POA: Diagnosis not present

## 2021-05-30 DIAGNOSIS — I10 Essential (primary) hypertension: Secondary | ICD-10-CM | POA: Diagnosis not present

## 2021-10-23 DIAGNOSIS — M7918 Myalgia, other site: Secondary | ICD-10-CM | POA: Diagnosis not present

## 2021-12-04 DIAGNOSIS — Z23 Encounter for immunization: Secondary | ICD-10-CM | POA: Diagnosis not present

## 2022-03-08 DIAGNOSIS — I10 Essential (primary) hypertension: Secondary | ICD-10-CM | POA: Diagnosis not present

## 2022-03-08 DIAGNOSIS — E1169 Type 2 diabetes mellitus with other specified complication: Secondary | ICD-10-CM | POA: Diagnosis not present

## 2022-03-08 DIAGNOSIS — J4530 Mild persistent asthma, uncomplicated: Secondary | ICD-10-CM | POA: Diagnosis not present

## 2022-03-08 DIAGNOSIS — E782 Mixed hyperlipidemia: Secondary | ICD-10-CM | POA: Diagnosis not present

## 2022-07-03 DIAGNOSIS — B354 Tinea corporis: Secondary | ICD-10-CM | POA: Diagnosis not present

## 2022-08-08 DIAGNOSIS — B359 Dermatophytosis, unspecified: Secondary | ICD-10-CM | POA: Diagnosis not present

## 2022-09-23 DIAGNOSIS — E782 Mixed hyperlipidemia: Secondary | ICD-10-CM | POA: Diagnosis not present

## 2022-09-23 DIAGNOSIS — E1169 Type 2 diabetes mellitus with other specified complication: Secondary | ICD-10-CM | POA: Diagnosis not present

## 2022-09-23 DIAGNOSIS — Z125 Encounter for screening for malignant neoplasm of prostate: Secondary | ICD-10-CM | POA: Diagnosis not present

## 2022-09-23 DIAGNOSIS — D489 Neoplasm of uncertain behavior, unspecified: Secondary | ICD-10-CM | POA: Diagnosis not present

## 2022-09-23 DIAGNOSIS — Z0000 Encounter for general adult medical examination without abnormal findings: Secondary | ICD-10-CM | POA: Diagnosis not present

## 2022-09-23 DIAGNOSIS — I10 Essential (primary) hypertension: Secondary | ICD-10-CM | POA: Diagnosis not present

## 2022-11-26 DIAGNOSIS — K429 Umbilical hernia without obstruction or gangrene: Secondary | ICD-10-CM | POA: Diagnosis not present

## 2022-11-29 ENCOUNTER — Other Ambulatory Visit: Payer: Self-pay | Admitting: Surgery

## 2022-11-29 DIAGNOSIS — K429 Umbilical hernia without obstruction or gangrene: Secondary | ICD-10-CM | POA: Diagnosis not present

## 2022-12-19 DIAGNOSIS — K420 Umbilical hernia with obstruction, without gangrene: Secondary | ICD-10-CM | POA: Diagnosis not present

## 2023-01-21 DIAGNOSIS — K420 Umbilical hernia with obstruction, without gangrene: Secondary | ICD-10-CM | POA: Diagnosis not present

## 2023-03-25 DIAGNOSIS — E1169 Type 2 diabetes mellitus with other specified complication: Secondary | ICD-10-CM | POA: Diagnosis not present

## 2023-03-25 DIAGNOSIS — I10 Essential (primary) hypertension: Secondary | ICD-10-CM | POA: Diagnosis not present

## 2023-03-25 DIAGNOSIS — J4530 Mild persistent asthma, uncomplicated: Secondary | ICD-10-CM | POA: Diagnosis not present

## 2023-03-25 DIAGNOSIS — E782 Mixed hyperlipidemia: Secondary | ICD-10-CM | POA: Diagnosis not present

## 2023-04-28 DIAGNOSIS — G4733 Obstructive sleep apnea (adult) (pediatric): Secondary | ICD-10-CM | POA: Diagnosis not present

## 2023-04-28 DIAGNOSIS — I10 Essential (primary) hypertension: Secondary | ICD-10-CM | POA: Diagnosis not present

## 2023-06-09 DIAGNOSIS — G4733 Obstructive sleep apnea (adult) (pediatric): Secondary | ICD-10-CM | POA: Diagnosis not present

## 2023-07-10 DIAGNOSIS — G4733 Obstructive sleep apnea (adult) (pediatric): Secondary | ICD-10-CM | POA: Diagnosis not present

## 2023-07-15 DIAGNOSIS — G5701 Lesion of sciatic nerve, right lower limb: Secondary | ICD-10-CM | POA: Diagnosis not present

## 2023-08-10 DIAGNOSIS — G4733 Obstructive sleep apnea (adult) (pediatric): Secondary | ICD-10-CM | POA: Diagnosis not present

## 2023-08-19 DIAGNOSIS — R0989 Other specified symptoms and signs involving the circulatory and respiratory systems: Secondary | ICD-10-CM | POA: Diagnosis not present

## 2023-09-01 DIAGNOSIS — I10 Essential (primary) hypertension: Secondary | ICD-10-CM | POA: Diagnosis not present

## 2023-09-01 DIAGNOSIS — G4733 Obstructive sleep apnea (adult) (pediatric): Secondary | ICD-10-CM | POA: Diagnosis not present

## 2023-09-09 DIAGNOSIS — G4733 Obstructive sleep apnea (adult) (pediatric): Secondary | ICD-10-CM | POA: Diagnosis not present

## 2023-09-29 DIAGNOSIS — Z125 Encounter for screening for malignant neoplasm of prostate: Secondary | ICD-10-CM | POA: Diagnosis not present

## 2023-09-29 DIAGNOSIS — I10 Essential (primary) hypertension: Secondary | ICD-10-CM | POA: Diagnosis not present

## 2023-09-29 DIAGNOSIS — Z0000 Encounter for general adult medical examination without abnormal findings: Secondary | ICD-10-CM | POA: Diagnosis not present

## 2023-09-29 DIAGNOSIS — J4530 Mild persistent asthma, uncomplicated: Secondary | ICD-10-CM | POA: Diagnosis not present

## 2023-09-29 DIAGNOSIS — E1169 Type 2 diabetes mellitus with other specified complication: Secondary | ICD-10-CM | POA: Diagnosis not present

## 2023-09-29 DIAGNOSIS — Z23 Encounter for immunization: Secondary | ICD-10-CM | POA: Diagnosis not present

## 2023-09-29 DIAGNOSIS — E782 Mixed hyperlipidemia: Secondary | ICD-10-CM | POA: Diagnosis not present

## 2023-12-15 DIAGNOSIS — G4733 Obstructive sleep apnea (adult) (pediatric): Secondary | ICD-10-CM | POA: Diagnosis not present

## 2023-12-15 DIAGNOSIS — I10 Essential (primary) hypertension: Secondary | ICD-10-CM | POA: Diagnosis not present

## 2024-03-29 DIAGNOSIS — E118 Type 2 diabetes mellitus with unspecified complications: Secondary | ICD-10-CM | POA: Diagnosis not present

## 2024-03-29 DIAGNOSIS — E782 Mixed hyperlipidemia: Secondary | ICD-10-CM | POA: Diagnosis not present

## 2024-03-29 DIAGNOSIS — N529 Male erectile dysfunction, unspecified: Secondary | ICD-10-CM | POA: Diagnosis not present

## 2024-03-29 DIAGNOSIS — I10 Essential (primary) hypertension: Secondary | ICD-10-CM | POA: Diagnosis not present

## 2024-05-11 DIAGNOSIS — I10 Essential (primary) hypertension: Secondary | ICD-10-CM | POA: Diagnosis not present

## 2024-05-11 DIAGNOSIS — G4733 Obstructive sleep apnea (adult) (pediatric): Secondary | ICD-10-CM | POA: Diagnosis not present

## 2024-09-06 DIAGNOSIS — G4733 Obstructive sleep apnea (adult) (pediatric): Secondary | ICD-10-CM | POA: Diagnosis not present

## 2024-10-04 DIAGNOSIS — L570 Actinic keratosis: Secondary | ICD-10-CM | POA: Diagnosis not present

## 2024-10-04 DIAGNOSIS — E118 Type 2 diabetes mellitus with unspecified complications: Secondary | ICD-10-CM | POA: Diagnosis not present

## 2024-10-04 DIAGNOSIS — I10 Essential (primary) hypertension: Secondary | ICD-10-CM | POA: Diagnosis not present

## 2024-10-04 DIAGNOSIS — Z125 Encounter for screening for malignant neoplasm of prostate: Secondary | ICD-10-CM | POA: Diagnosis not present

## 2024-10-04 DIAGNOSIS — Z0000 Encounter for general adult medical examination without abnormal findings: Secondary | ICD-10-CM | POA: Diagnosis not present

## 2024-10-04 DIAGNOSIS — J4530 Mild persistent asthma, uncomplicated: Secondary | ICD-10-CM | POA: Diagnosis not present

## 2024-10-04 DIAGNOSIS — E782 Mixed hyperlipidemia: Secondary | ICD-10-CM | POA: Diagnosis not present
